# Patient Record
Sex: Male | Born: 1950 | Race: Black or African American | Hispanic: No | Smoking: Current every day smoker
Health system: Southern US, Community
[De-identification: ages and names within clinical notes are randomized; demographics above are authoritative.]

## PROBLEM LIST (undated history)

## (undated) DIAGNOSIS — I639 Cerebral infarction, unspecified: Secondary | ICD-10-CM

## (undated) DIAGNOSIS — F329 Major depressive disorder, single episode, unspecified: Secondary | ICD-10-CM

## (undated) DIAGNOSIS — I10 Essential (primary) hypertension: Secondary | ICD-10-CM

## (undated) DIAGNOSIS — F32A Depression, unspecified: Secondary | ICD-10-CM

## (undated) DIAGNOSIS — I509 Heart failure, unspecified: Secondary | ICD-10-CM

## (undated) HISTORY — DX: Depression, unspecified: F32.A

## (undated) HISTORY — PX: FOOT SURGERY: SHX648

---
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
## 1999-04-06 ENCOUNTER — Emergency Department (HOSPITAL_COMMUNITY): Admission: EM | Admit: 1999-04-06 | Discharge: 1999-04-06 | Payer: Self-pay | Admitting: Emergency Medicine

## 1999-04-07 ENCOUNTER — Encounter: Payer: Self-pay | Admitting: Emergency Medicine

## 1999-06-28 ENCOUNTER — Encounter: Payer: Self-pay | Admitting: *Deleted

## 1999-06-28 ENCOUNTER — Emergency Department (HOSPITAL_COMMUNITY): Admission: EM | Admit: 1999-06-28 | Discharge: 1999-06-28 | Payer: Self-pay | Admitting: *Deleted

## 1999-09-21 ENCOUNTER — Emergency Department (HOSPITAL_COMMUNITY): Admission: EM | Admit: 1999-09-21 | Discharge: 1999-09-21 | Payer: Self-pay | Admitting: Emergency Medicine

## 1999-09-21 ENCOUNTER — Encounter: Payer: Self-pay | Admitting: Emergency Medicine

## 1999-12-19 ENCOUNTER — Emergency Department (HOSPITAL_COMMUNITY): Admission: EM | Admit: 1999-12-19 | Discharge: 1999-12-20 | Payer: Self-pay

## 2001-07-07 ENCOUNTER — Emergency Department (HOSPITAL_COMMUNITY): Admission: EM | Admit: 2001-07-07 | Discharge: 2001-07-07 | Payer: Self-pay | Admitting: Emergency Medicine

## 2001-11-20 ENCOUNTER — Encounter: Payer: Self-pay | Admitting: Emergency Medicine

## 2001-11-20 ENCOUNTER — Emergency Department (HOSPITAL_COMMUNITY): Admission: EM | Admit: 2001-11-20 | Discharge: 2001-11-20 | Payer: Self-pay | Admitting: *Deleted

## 2003-05-20 ENCOUNTER — Emergency Department (HOSPITAL_COMMUNITY): Admission: EM | Admit: 2003-05-20 | Discharge: 2003-05-20 | Payer: Self-pay | Admitting: Emergency Medicine

## 2004-11-13 ENCOUNTER — Emergency Department (HOSPITAL_COMMUNITY): Admission: EM | Admit: 2004-11-13 | Discharge: 2004-11-13 | Payer: Self-pay | Admitting: Emergency Medicine

## 2004-11-30 ENCOUNTER — Emergency Department (HOSPITAL_COMMUNITY): Admission: EM | Admit: 2004-11-30 | Discharge: 2004-11-30 | Payer: Self-pay | Admitting: *Deleted

## 2006-01-03 ENCOUNTER — Emergency Department (HOSPITAL_COMMUNITY): Admission: EM | Admit: 2006-01-03 | Discharge: 2006-01-03 | Payer: Self-pay | Admitting: *Deleted

## 2006-04-22 ENCOUNTER — Emergency Department (HOSPITAL_COMMUNITY): Admission: EM | Admit: 2006-04-22 | Discharge: 2006-04-22 | Payer: Self-pay | Admitting: Family Medicine

## 2007-03-06 ENCOUNTER — Emergency Department (HOSPITAL_COMMUNITY): Admission: EM | Admit: 2007-03-06 | Discharge: 2007-03-06 | Payer: Self-pay | Admitting: Emergency Medicine

## 2007-07-22 ENCOUNTER — Emergency Department (HOSPITAL_COMMUNITY): Admission: EM | Admit: 2007-07-22 | Discharge: 2007-07-22 | Payer: Self-pay | Admitting: Emergency Medicine

## 2008-10-16 ENCOUNTER — Emergency Department (HOSPITAL_COMMUNITY): Admission: EM | Admit: 2008-10-16 | Discharge: 2008-10-16 | Payer: Self-pay | Admitting: Emergency Medicine

## 2009-03-05 ENCOUNTER — Emergency Department (HOSPITAL_COMMUNITY): Admission: EM | Admit: 2009-03-05 | Discharge: 2009-03-05 | Payer: Self-pay | Admitting: Emergency Medicine

## 2009-04-20 ENCOUNTER — Emergency Department (HOSPITAL_COMMUNITY): Admission: EM | Admit: 2009-04-20 | Discharge: 2009-04-20 | Payer: Self-pay | Admitting: Emergency Medicine

## 2010-08-05 IMAGING — CR DG CHEST 2V
2 series · 2 of 2 positions shown · non-contrast
Comparison: 11/30/2004

CLINICAL DATA: Fall 2 days ago, left-sided chest pain

CHEST - 2 VIEW

[w chest pa *]
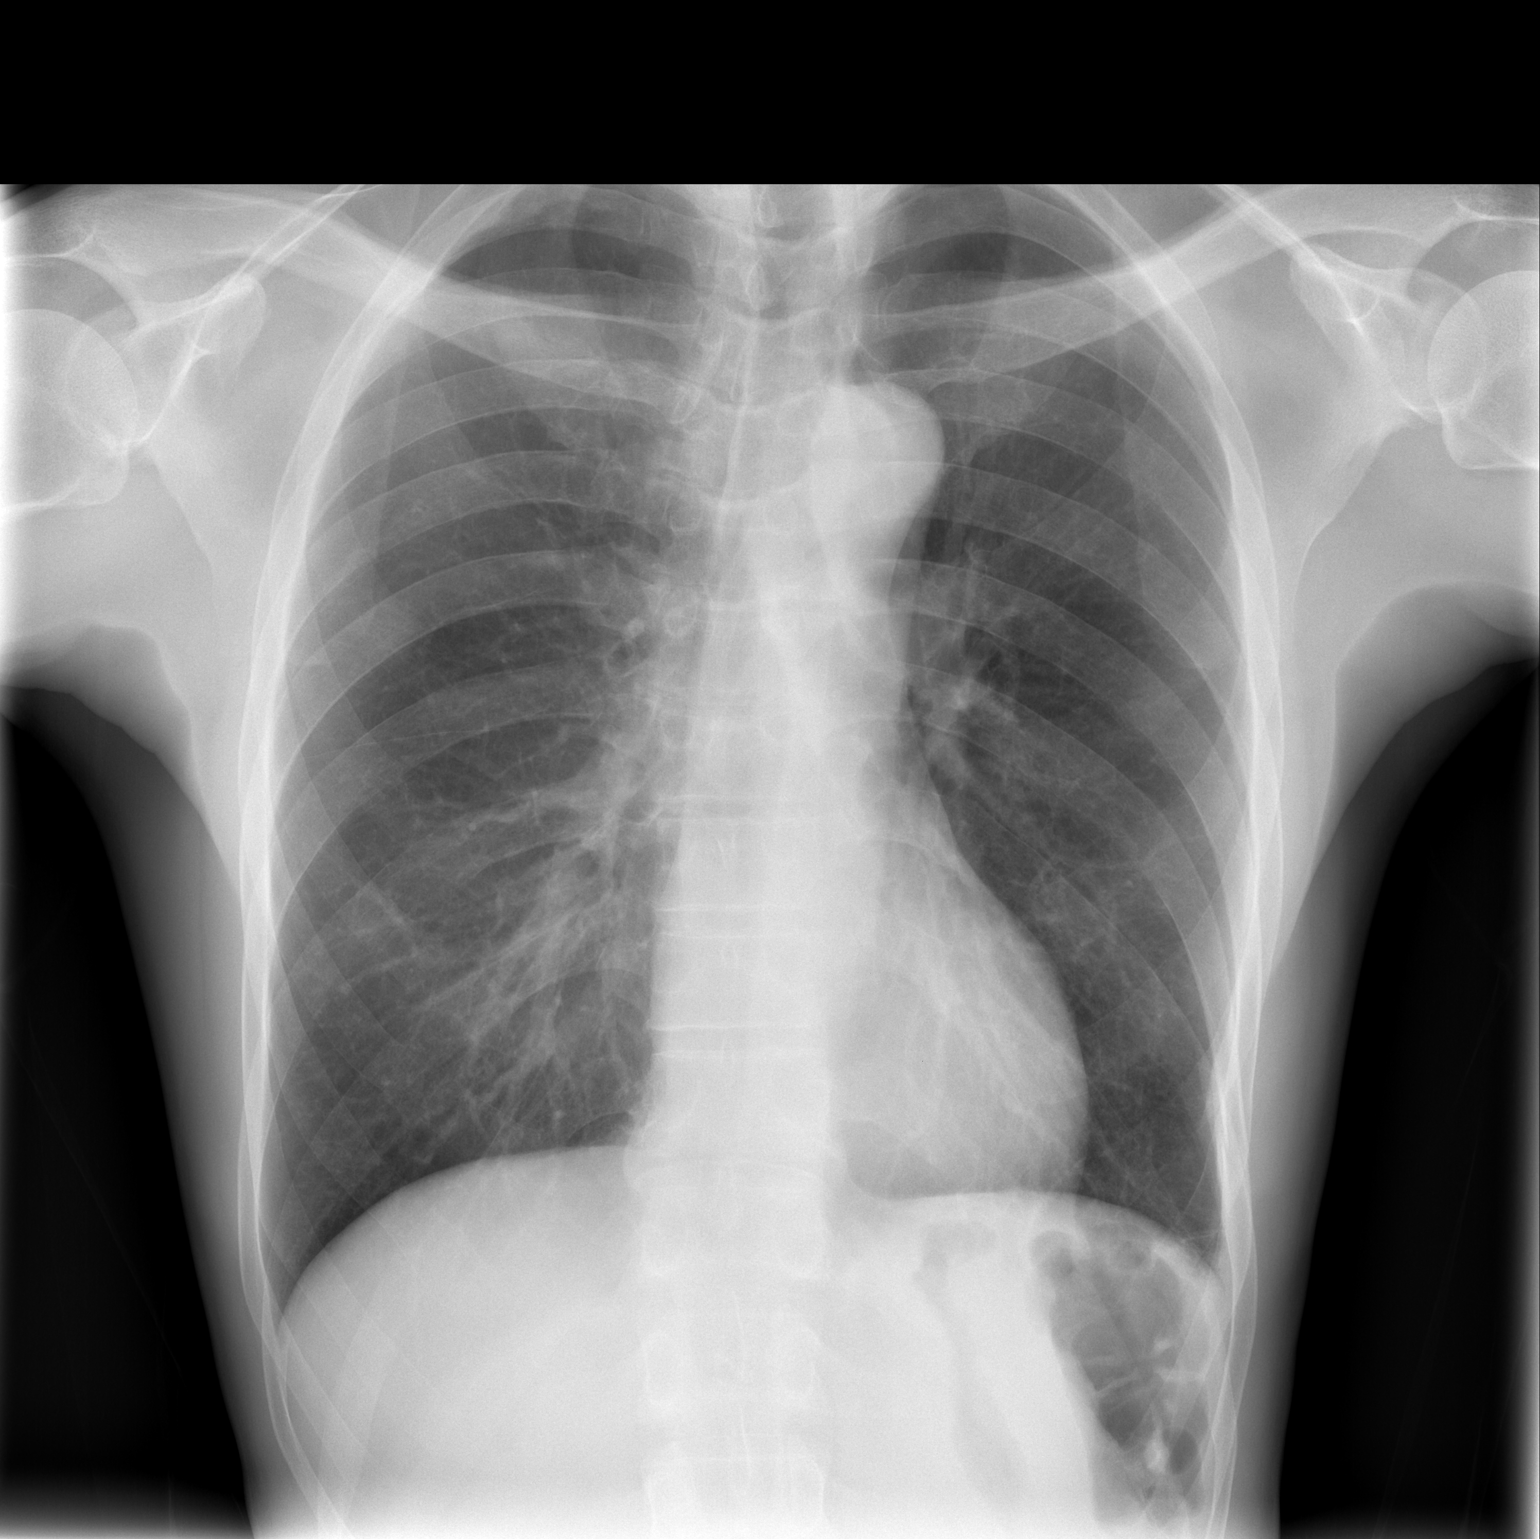

[w chest lat]
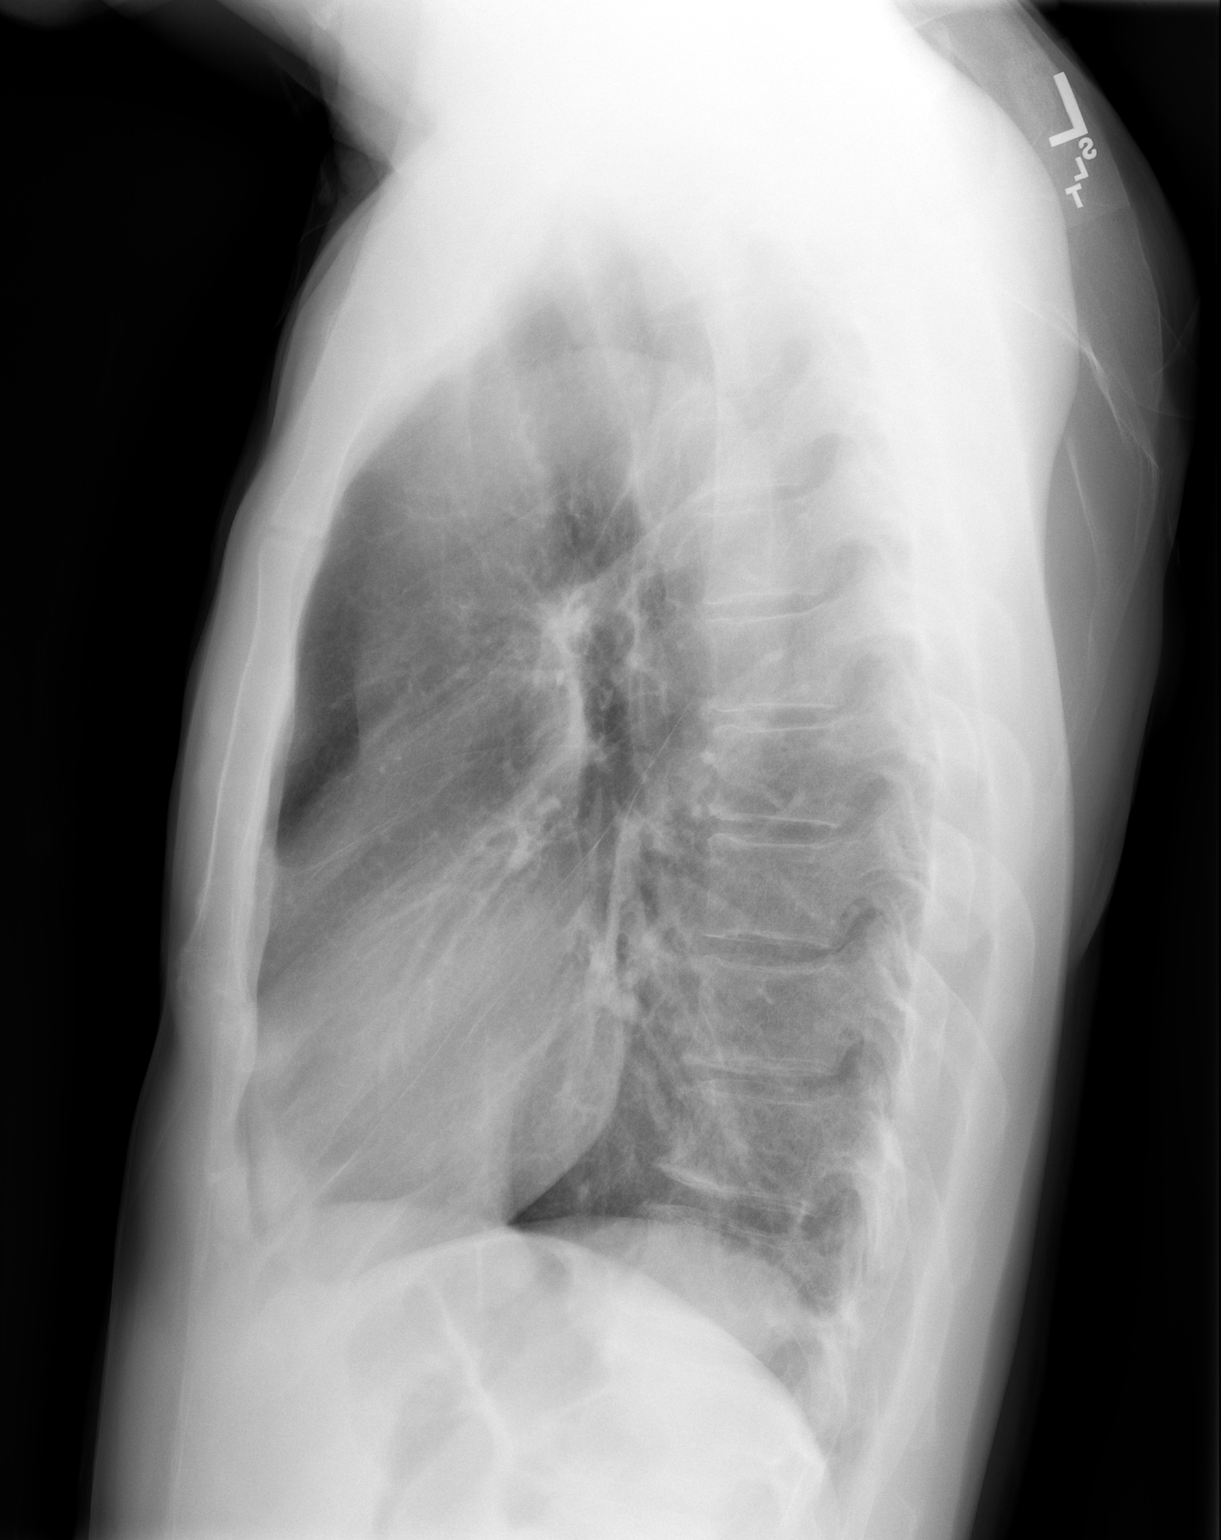

[2 of 2 positions shown; findings below may reference images not displayed]

FINDINGS: There is a nondisplaced fracture of the left lateral
inferior ribs, likely the 8th rib.  No pneumothorax.  Heart size is
normal.  Lungs are clear.
IMPRESSION: Nondisplaced acute fracture of the left lateral lower rib, likely
the 8th rib level.

## 2010-12-21 LAB — POCT CARDIAC MARKERS
CKMB, poc: <1 — ABNORMAL LOW
Troponin i, poc: <0.05

## 2010-12-21 LAB — I-STAT 8, (EC8 V) (CONVERTED LAB)
Chloride: 109
Glucose, Bld: 88
Hemoglobin: 15.6
Operator id: 208821
Potassium: 3.5
Sodium: 142
TCO2: 26
pH, Ven: 7.361 — ABNORMAL HIGH

## 2012-10-12 ENCOUNTER — Emergency Department (HOSPITAL_COMMUNITY)
Admission: EM | Admit: 2012-10-12 | Discharge: 2012-10-12 | Disposition: A | Discharge status: Home / Self Care | Payer: Self-pay | Attending: Emergency Medicine | Admitting: Emergency Medicine

## 2012-10-12 ENCOUNTER — Encounter (HOSPITAL_COMMUNITY): Payer: Self-pay | Admitting: *Deleted

## 2012-10-12 DIAGNOSIS — K0889 Other specified disorders of teeth and supporting structures: Secondary | ICD-10-CM

## 2012-10-12 DIAGNOSIS — K089 Disorder of teeth and supporting structures, unspecified: Secondary | ICD-10-CM | POA: Insufficient documentation

## 2012-10-12 DIAGNOSIS — I10 Essential (primary) hypertension: Secondary | ICD-10-CM | POA: Insufficient documentation

## 2012-10-12 HISTORY — DX: Essential (primary) hypertension: I10

## 2012-10-12 MED ORDER — HYDROCHLOROTHIAZIDE 25 MG PO TABS: 25.0000 mg | ORAL_TABLET | Freq: Every day | ORAL | Status: DC

## 2012-10-12 MED ORDER — AMOXICILLIN 500 MG PO CAPS
500.0000 mg | ORAL_CAPSULE | Freq: Once | ORAL | Status: AC
  Administered 2012-10-12: 500 mg via ORAL
  Filled 2012-10-12: qty 1

## 2012-10-12 MED ORDER — OXYCODONE-ACETAMINOPHEN 5-325 MG PO TABS
2.0000 | ORAL_TABLET | Freq: Once | ORAL | Status: AC
  Administered 2012-10-12: 2 via ORAL
  Filled 2012-10-12: qty 2

## 2012-10-12 MED ORDER — HYDROCHLOROTHIAZIDE 25 MG PO TABS
25.0000 mg | ORAL_TABLET | Freq: Once | ORAL | Status: AC
  Administered 2012-10-12: 25 mg via ORAL
  Filled 2012-10-12: qty 1

## 2012-10-12 MED ORDER — OXYCODONE-ACETAMINOPHEN 5-325 MG PO TABS: 1.0000 | ORAL_TABLET | Freq: Three times a day (TID) | ORAL | Status: DC | PRN

## 2012-10-12 MED ORDER — AMOXICILLIN 500 MG PO CAPS: 500.0000 mg | ORAL_CAPSULE | Freq: Three times a day (TID) | ORAL | Status: DC

## 2012-10-12 NOTE — ED Provider Notes (Signed)
  CSN: [id number]     Arrival date & time 10/12/12  1804 History     First MD Initiated Contact with Patient 10/12/12 1922     Chief Complaint  Patient presents with  . Dental Pain   (Consider location/radiation/quality/duration/timing/severity/associated sxs/prior Treatment) HPI\  Patient presents to the emergency department with a dental complaint. Symptoms began 2 days ago. The patient has tried to alleviate pain with OTC medications.  Pain rated at a 10/10, characterized as throbbing in nature and located right upper molar. Patient denies fever, night sweats, chills, difficulty swallowing or opening mouth, SOB, nuchal rigidity or decreased ROM of neck.  Patient does not have a dentist and requests a resource guide at discharge.  Pt noted to be hypertensive in triage.  Says he was diagnosed with hypertension but has not been on medication for years. No PCP. asymptomatic   Past Medical History  Diagnosis Date  . Hypertension    History reviewed. No pertinent past surgical history. History reviewed. No pertinent family history. History  Substance Use Topics  . Smoking status: Not on file  . Smokeless tobacco: Not on file  . Alcohol Use: Not on file    Review of Systems  HENT: Positive for dental problem.   All other systems reviewed and are negative.    Allergies  Review of patient's allergies indicates no known allergies.  Home Medications  No current outpatient prescriptions on file. BP 182/119  Pulse 90  Temp(Src) 98.3 F (36.8 C) (Oral)  Resp 16  SpO2 99% Physical Exam  Nursing note and vitals reviewed. Constitutional: He appears well-developed and well-nourished. No distress.  HENT:  Head: Normocephalic and atraumatic.  Mouth/Throat: Dental caries present.    Wide spread dental decay  Eyes: Conjunctivae and EOM are normal. Pupils are equal, round, and reactive to light.  Neck: Normal range of motion. Neck supple.  Cardiovascular: Normal rate and regular  rhythm.   Pulmonary/Chest: Effort normal and breath sounds normal.  Abdominal: Soft.  Neurological: He is alert.  Skin: Skin is warm and dry.    ED Course   Procedures (including critical care time)  Labs Reviewed - No data to display No results found. 1. Hypertension   2. Toothache     MDM  Discussed blood pressure with patient told him he is at risk for stroke and other adverse events if he does not get his blood pressure to control. Will start him on hydrochlorothiazide 25 mg and refer to PCP.  Patient has dental pain. No emergent s/sx's present. Patent airway. No trismus.  Will be given pain medication and antibiotics. I discussed the need to call dentist within 24/48 hours for follow-up. Dental referral given. Return to ED precautions given.  Pt voiced understanding and has agreed to follow-up.    Dorthula Matas, PA-C 10/12/12 1935

## 2012-10-12 NOTE — ED Notes (Signed)
Pt in c/o toothache and jaw swelling x2 days, pt states the tooth is loose and he thinks he has an infection in the tissue, facial swelling noted

## 2012-10-12 NOTE — ED Provider Notes (Signed)
Medical screening examination/treatment/procedure(s) were performed by non-physician practitioner and as supervising physician I was immediately available for consultation/collaboration.  Charles B. Sheldon, MD 10/12/12 2213 

## 2014-06-09 ENCOUNTER — Emergency Department (HOSPITAL_COMMUNITY): Payer: Self-pay

## 2014-06-09 ENCOUNTER — Emergency Department (HOSPITAL_COMMUNITY)
Admission: EM | Admit: 2014-06-09 | Discharge: 2014-06-09 | Disposition: A | Discharge status: Home / Self Care | Payer: Self-pay | Attending: Emergency Medicine | Admitting: Emergency Medicine

## 2014-06-09 ENCOUNTER — Encounter (HOSPITAL_COMMUNITY): Payer: Self-pay | Admitting: Neurology

## 2014-06-09 DIAGNOSIS — I10 Essential (primary) hypertension: Secondary | ICD-10-CM | POA: Insufficient documentation

## 2014-06-09 DIAGNOSIS — Y9389 Activity, other specified: Secondary | ICD-10-CM | POA: Insufficient documentation

## 2014-06-09 DIAGNOSIS — W11XXXA Fall on and from ladder, initial encounter: Secondary | ICD-10-CM | POA: Insufficient documentation

## 2014-06-09 DIAGNOSIS — Z720 Tobacco use: Secondary | ICD-10-CM | POA: Insufficient documentation

## 2014-06-09 DIAGNOSIS — Y929 Unspecified place or not applicable: Secondary | ICD-10-CM | POA: Insufficient documentation

## 2014-06-09 DIAGNOSIS — S199XXA Unspecified injury of neck, initial encounter: Secondary | ICD-10-CM | POA: Insufficient documentation

## 2014-06-09 DIAGNOSIS — Y998 Other external cause status: Secondary | ICD-10-CM | POA: Insufficient documentation

## 2014-06-09 DIAGNOSIS — R55 Syncope and collapse: Secondary | ICD-10-CM | POA: Insufficient documentation

## 2014-06-09 DIAGNOSIS — W19XXXA Unspecified fall, initial encounter: Secondary | ICD-10-CM

## 2014-06-09 DIAGNOSIS — Z79899 Other long term (current) drug therapy: Secondary | ICD-10-CM | POA: Insufficient documentation

## 2014-06-09 DIAGNOSIS — Z792 Long term (current) use of antibiotics: Secondary | ICD-10-CM | POA: Insufficient documentation

## 2014-06-09 DIAGNOSIS — Z8673 Personal history of transient ischemic attack (TIA), and cerebral infarction without residual deficits: Secondary | ICD-10-CM | POA: Insufficient documentation

## 2014-06-09 HISTORY — DX: Cerebral infarction, unspecified: I63.9

## 2014-06-09 LAB — BASIC METABOLIC PANEL
ANION GAP: 12 (ref 5–15)
BUN: 11 mg/dL (ref 6–23)
CALCIUM: 9.1 mg/dL (ref 8.4–10.5)
CHLORIDE: 109 mmol/L (ref 96–112)
CO2: 22 mmol/L (ref 19–32)
CREATININE: 1.1 mg/dL (ref 0.50–1.35)
GFR calc Af Amer: 82 mL/min — ABNORMAL LOW (ref >90)
GFR, EST NON AFRICAN AMERICAN: 71 mL/min — AB (ref >90)
Glucose, Bld: 100 mg/dL — ABNORMAL HIGH (ref 70–99)
Potassium: 3.8 mmol/L (ref 3.5–5.1)
SODIUM: 143 mmol/L (ref 135–145)

## 2014-06-09 LAB — CBC
HCT: 40.3 % (ref 39.0–52.0)
HEMOGLOBIN: 13 g/dL (ref 13.0–17.0)
MCH: 30.8 pg (ref 26.0–34.0)
MCHC: 32.3 g/dL (ref 30.0–36.0)
MCV: 95.5 fL (ref 78.0–100.0)
Platelets: 221 10*3/uL (ref 150–400)
RBC: 4.22 MIL/uL (ref 4.22–5.81)
RDW: 13.1 % (ref 11.5–15.5)
WBC: 5.1 10*3/uL (ref 4.0–10.5)

## 2014-06-09 LAB — I-STAT TROPONIN, ED: TROPONIN I, POC: 0 ng/mL (ref 0.00–0.08)

## 2014-06-09 MED ORDER — HYDROCHLOROTHIAZIDE 25 MG PO TABS: 25.0000 mg | ORAL_TABLET | Freq: Every day | ORAL | Status: DC

## 2014-06-09 NOTE — ED Notes (Signed)
C-collar applied

## 2014-06-09 NOTE — ED Notes (Signed)
Pt reports today at 1000 was on a ladder and fell 6 ft, he woke up on his back does not remember what happened. Now c/o left side feeling numb. Equal grips, no drift. Pt is a x 4. Moves all extremities.

## 2014-06-09 NOTE — Discharge Instructions (Signed)
You were offered admission for evaluation of why he passed out and you have deferred at this time. The risk of sudden cardiac death has been explained to you and you except this. Please return here if you change in mind and follow-up at the [facility] for management of your high blood pressure   Hypertension Hypertension, commonly called high blood pressure, is when the force of blood pumping through your arteries is too strong. Your arteries are the blood vessels that carry blood from your heart throughout your body. A blood pressure reading consists of a higher number over a lower number, such as 110/72. The higher number (systolic) is the pressure inside your arteries when your heart pumps. The lower number (diastolic) is the pressure inside your arteries when your heart relaxes. Ideally you want your blood pressure below 120/80. Hypertension forces your heart to work harder to pump blood. Your arteries may become narrow or stiff. Having hypertension puts you at risk for heart disease, stroke, and other problems.  RISK FACTORS Some risk factors for high blood pressure are controllable. Others are not.  Risk factors you cannot control include:   Race. You may be at higher risk if you are African American.  Age. Risk increases with age.  Gender. Men are at higher risk than women before age 4 years. After age 49, women are at higher risk than men. Risk factors you can control include:  Not getting enough exercise or physical activity.  Being overweight.  Getting too much fat, sugar, calories, or salt in your diet.  Drinking too much alcohol. SIGNS AND SYMPTOMS Hypertension does not usually cause signs or symptoms. Extremely high blood pressure (hypertensive crisis) may cause headache, anxiety, shortness of breath, and nosebleed. DIAGNOSIS  To check if you have hypertension, your health care provider will measure your blood pressure while you are seated, with your arm held  at the level of your heart. It should be measured at least twice using the same arm. Certain conditions can cause a difference in blood pressure between your right and left arms. A blood pressure reading that is higher than normal on one occasion does not mean that you need treatment. If one blood pressure reading is high, ask your health care provider about having it checked again. TREATMENT  Treating high blood pressure includes making lifestyle changes and possibly taking medicine. Living a healthy lifestyle can help lower high blood pressure. You may need to change some of your habits. Lifestyle changes may include:  Following the DASH diet. This diet is high in fruits, vegetables, and whole grains. It is low in salt, red meat, and added sugars.  Getting at least 2 hours of brisk physical activity every week.  Losing weight if necessary.  Not smoking.  Limiting alcoholic beverages.  Learning ways to reduce stress. If lifestyle changes are not enough to get your blood pressure under control, your health care provider may prescribe medicine. You may need to take more than one. Work closely with your health care provider to understand the risks and benefits. HOME CARE INSTRUCTIONS  Have your blood pressure rechecked as directed by your health care provider.   Take medicines only as directed by your health care provider. Follow the directions carefully. Blood pressure medicines must be taken as prescribed. The medicine does not work as well when you skip doses. Skipping doses also puts you at risk for problems.   Do not smoke.   Monitor your blood pressure at home as  directed by your health care provider. SEEK MEDICAL CARE IF:   You think you are having a reaction to medicines taken.  You have recurrent headaches or feel dizzy.  You have swelling in your ankles.  You have trouble with your vision. SEEK IMMEDIATE MEDICAL CARE IF:  You develop a severe headache or  confusion.  You have unusual weakness, numbness, or feel faint.  You have severe chest or abdominal pain.  You vomit repeatedly.  You have trouble breathing. MAKE SURE YOU:   Understand these instructions.  Will watch your condition.  Will get help right away if you are not doing well or get worse. Document Released: 03/04/2005 Document Revised: 07/19/2013 Document Reviewed: 12/25/2012 [facility] Patient Information 2015 [place], [place]. This information is not intended to replace advice given to you by your health care provider. Make sure you discuss any questions you have with your health care provider. Syncope Syncope is a medical term for fainting or passing out. This means you lose consciousness and drop to the ground. People are generally unconscious for less than 5 minutes. You may have some muscle twitches for up to 15 seconds before waking up and returning to normal. Syncope occurs more often in older adults, but it can happen to anyone. While most causes of syncope are not dangerous, syncope can be a sign of a serious medical problem. It is important to seek medical care.  CAUSES  Syncope is caused by a sudden drop in blood flow to the brain. The specific cause is often not determined. Factors that can bring on syncope include:  Taking medicines that lower blood pressure.  Sudden changes in posture, such as standing up quickly.  Taking more medicine than prescribed.  Standing in one place for too long.  Seizure disorders.  Dehydration and excessive exposure to heat.  Low blood sugar (hypoglycemia).  Straining to have a bowel movement.  Heart disease, irregular heartbeat, or other circulatory problems.  Fear, emotional distress, seeing blood, or severe pain. SYMPTOMS  Right before fainting, you may:  Feel dizzy or light-headed.  Feel nauseous.  See all white or all black in your field of vision.  Have cold, clammy skin. DIAGNOSIS  Your health care provider  will ask about your symptoms, perform a physical exam, and perform an electrocardiogram (ECG) to record the electrical activity of your heart. Your health care provider may also perform other heart or blood tests to determine the cause of your syncope which may include:  Transthoracic echocardiogram (TTE). During echocardiography, sound waves are used to evaluate how blood flows through your heart.  Transesophageal echocardiogram (TEE).  Cardiac monitoring. This allows your health care provider to monitor your heart rate and rhythm in real time.  Holter monitor. This is a portable device that records your heartbeat and can help diagnose heart arrhythmias. It allows your health care provider to track your heart activity for several days, if needed.  Stress tests by exercise or by giving medicine that makes the heart beat faster. TREATMENT  In most cases, no treatment is needed. Depending on the cause of your syncope, your health care provider may recommend changing or stopping some of your medicines. HOME CARE INSTRUCTIONS  Have someone stay with you until you feel stable.  Do not drive, use machinery, or play sports until your health care provider says it is okay.  Keep all follow-up appointments as directed by your health care provider.  Lie down right away if you start feeling like you might  faint. Breathe deeply and steadily. Wait until all the symptoms have passed.  Drink enough fluids to keep your urine clear or pale yellow.  If you are taking blood pressure or heart medicine, get up slowly and take several minutes to sit and then stand. This can reduce dizziness. SEEK IMMEDIATE MEDICAL CARE IF:   You have a severe headache.  You have unusual pain in the chest, abdomen, or back.  You are bleeding from your mouth or rectum, or you have black or tarry stool.  You have an irregular or very fast heartbeat.  You have pain with breathing.  You have repeated fainting or  seizure-like jerking during an episode.  You faint when sitting or lying down.  You have confusion.  You have trouble walking.  You have severe weakness.  You have vision problems. If you fainted, call your local emergency services (911 in [place] Do not drive yourself to the hospital.  MAKE SURE YOU:  Understand these instructions.  Will watch your condition.  Will get help right away if you are not doing well or get worse. Document Released: 03/04/2005 Document Revised: 03/09/2013 Document Reviewed: 05/03/2011 [facility] Patient Information 2015 [place], [place]. This information is not intended to replace advice given to you by your health care provider. Make sure you discuss any questions you have with your health care provider.

## 2014-06-09 NOTE — ED Provider Notes (Signed)
CSN: [id number]     Arrival date & time 06/09/14  1203 History   First MD Initiated Contact with Patient 06/09/14 1554     Chief Complaint  Patient presents with  . Loss of Consciousness  . Fall     (Consider location/radiation/quality/duration/timing/severity/associated sxs/prior Treatment) HPI Comments: Patient here after having a syncopal event while he was standing on a ladder possibly 6 feet high. States she became dizzy and has some nausea and then seen was on the ground. Denies any chest pain or shortness of breath. No headache prior to the event. Denied any palpitations. Complains of some muscle stiffness and pain to his left side at this time. Denies any weakness in his arms or legs. No recent illnesses. Doesn't take any medications on a regular basis. Denies any abdominal pain.  Patient is a 64 y.o. male presenting with syncope and fall. The history is provided by the patient.  Loss of Consciousness Fall    Past Medical History  Diagnosis Date  . Hypertension   . Stroke    History reviewed. No pertinent past surgical history. No family history on file. History  Substance Use Topics  . Smoking status: Current Every Day Smoker  . Smokeless tobacco: Not on file  . Alcohol Use: Yes    Review of Systems  Cardiovascular: Positive for syncope.  All other systems reviewed and are negative.     Allergies  Review of patient's allergies indicates no known allergies.  Home Medications   Prior to Admission medications   Medication Sig Start Date End Date Taking? Authorizing Provider  amoxicillin (AMOXIL) 500 MG capsule Take 1 capsule (500 mg total) by mouth 3 (three) times daily. 10/12/12   Tiffany Carlota Raspberry, PA-C  hydrochlorothiazide (HYDRODIURIL) 25 MG tablet Take 1 tablet (25 mg total) by mouth daily. 10/12/12   Delos Haring, PA-C  oxyCODONE-acetaminophen (PERCOCET/ROXICET) 5-325 MG per tablet Take 1-2 tablets by mouth every 8 (eight) hours as needed for pain. 10/12/12    Tiffany Carlota Raspberry, PA-C   BP 159/107 mmHg  Pulse 65  Temp(Src) 98.2 F (36.8 C) (Oral)  Resp 14  SpO2 98% Physical Exam  Constitutional: He is oriented to person, place, and time. He appears well-developed and well-nourished.  Non-toxic appearance. No distress.  HENT:  Head: Normocephalic and atraumatic.  Eyes: Conjunctivae, EOM and lids are normal. Pupils are equal, round, and reactive to light.  Neck: Normal range of motion. Neck supple. No tracheal deviation present. No thyroid mass present.  Cardiovascular: Normal rate, regular rhythm and normal heart sounds.  Exam reveals no gallop.   No murmur heard. Pulmonary/Chest: Effort normal and breath sounds normal. No stridor. No respiratory distress. He has no decreased breath sounds. He has no wheezes. He has no rhonchi. He has no rales.  Abdominal: Soft. Normal appearance and bowel sounds are normal. He exhibits no distension. There is no tenderness. There is no rebound and no CVA tenderness.  Musculoskeletal: Normal range of motion. He exhibits no edema or tenderness.  Neurological: He is alert and oriented to person, place, and time. He has normal strength. No cranial nerve deficit or sensory deficit. GCS eye subscore is 4. GCS verbal subscore is 5. GCS motor subscore is 6.  Skin: Skin is warm and dry. No abrasion and no rash noted.  Psychiatric: He has a normal mood and affect. His speech is normal and behavior is normal.  Nursing note and vitals reviewed.   ED Course  Procedures (including critical care time) Labs Review  Labs Reviewed  BASIC METABOLIC PANEL - Abnormal; Notable for the following:    Glucose, Bld 100 (*)    GFR calc non Af Amer 71 (*)    GFR calc Af Amer 82 (*)    All other components within normal limits  CBC  I-STAT TROPOININ, ED    Imaging Review Ct Head Wo Contrast  06/09/2014   CLINICAL DATA:  Syncope, fall from a ladder  EXAM: CT HEAD WITHOUT CONTRAST  CT CERVICAL SPINE WITHOUT CONTRAST  TECHNIQUE:  Multidetector CT imaging of the head and cervical spine was performed following the standard protocol without intravenous contrast. Multiplanar CT image reconstructions of the cervical spine were also generated.  COMPARISON:  None.  FINDINGS: CT HEAD FINDINGS  No skull fracture is noted. Paranasal sinuses and mastoid air cells are unremarkable.  Mild cerebral atrophy. Mild periventricular and patchy subcortical white matter decreased attenuation probable due to chronic small vessel ischemic changes. No definite acute cortical infarction. No mass lesion is noted on this unenhanced scan.  CT CERVICAL SPINE FINDINGS  Axial images of the cervical spine shows no acute fracture or subluxation. There is no pneumothorax in visualized lung apices. Computer processed images shows no acute fracture or subluxation. Degenerative changes are noted C1-C2 articulation. There is mild disc space flattening with mild anterior and mild posterior spurring at C3-C4 level. Mild anterior spurring noted lower endplate of C4 vertebral body. Moderate anterior spurring lower endplate of C5 vertebral body. There is mild disc space flattening at C5-C6 level. Significant disc space flattening with moderate anterior and mild posterior spurring at C6-C7 level. No prevertebral soft tissue swelling. Cervical airway is patent.  IMPRESSION: 1. No acute intracranial abnormality. Mild cerebral atrophy. Patchy subcortical and mild periventricular white matter decreased attenuation probable due to chronic small vessel ischemic changes. 2. No cervical spine acute fracture or subluxation. Multilevel degenerative changes as described above.   Electronically Signed   By: Lahoma Crocker M.D.   On: 06/09/2014 15:04   Ct Cervical Spine Wo Contrast  06/09/2014   CLINICAL DATA:  Syncope, fall from a ladder  EXAM: CT HEAD WITHOUT CONTRAST  CT CERVICAL SPINE WITHOUT CONTRAST  TECHNIQUE: Multidetector CT imaging of the head and cervical spine was performed following  the standard protocol without intravenous contrast. Multiplanar CT image reconstructions of the cervical spine were also generated.  COMPARISON:  None.  FINDINGS: CT HEAD FINDINGS  No skull fracture is noted. Paranasal sinuses and mastoid air cells are unremarkable.  Mild cerebral atrophy. Mild periventricular and patchy subcortical white matter decreased attenuation probable due to chronic small vessel ischemic changes. No definite acute cortical infarction. No mass lesion is noted on this unenhanced scan.  CT CERVICAL SPINE FINDINGS  Axial images of the cervical spine shows no acute fracture or subluxation. There is no pneumothorax in visualized lung apices. Computer processed images shows no acute fracture or subluxation. Degenerative changes are noted C1-C2 articulation. There is mild disc space flattening with mild anterior and mild posterior spurring at C3-C4 level. Mild anterior spurring noted lower endplate of C4 vertebral body. Moderate anterior spurring lower endplate of C5 vertebral body. There is mild disc space flattening at C5-C6 level. Significant disc space flattening with moderate anterior and mild posterior spurring at C6-C7 level. No prevertebral soft tissue swelling. Cervical airway is patent.  IMPRESSION: 1. No acute intracranial abnormality. Mild cerebral atrophy. Patchy subcortical and mild periventricular white matter decreased attenuation probable due to chronic small vessel ischemic changes. 2. No cervical  spine acute fracture or subluxation. Multilevel degenerative changes as described above.   Electronically Signed   By: Lahoma Crocker M.D.   On: 06/09/2014 15:04     EKG Interpretation   Date/Time:  Thursday June 09 2014 12:11:31 EDT Ventricular Rate:  86 PR Interval:  126 QRS Duration: 96 QT Interval:  388 QTC Calculation: 464 R Axis:   82 Text Interpretation:  Normal sinus rhythm Right atrial enlargement Minimal  voltage criteria for LVH, may be normal variant Nonspecific ST  abnormality  Abnormal ECG No significant change since last tracing Confirmed by Ardythe Klute   MD, Cortlin Marano ([id number]) on 06/09/2014 3:56:27 PM      MDM   Final diagnoses:  Fall    Patient offered admission for evaluation of syncope. He has deferred at this time. Risk of death explained to him and he accepts this. He has capacity to make this decision. Neurological exam is normal. Patient is hypertensive here and will be given a refill for his diuretic and referral to the [facility], MD 06/09/14 [id number]

## 2014-06-09 NOTE — ED Notes (Signed)
Pt states feels much better.  Denies numbness or pain at this time.

## 2014-06-09 NOTE — ED Notes (Signed)
MD aware of elevated bp 

## 2014-11-22 ENCOUNTER — Emergency Department (HOSPITAL_COMMUNITY)
Admission: EM | Admit: 2014-11-22 | Discharge: 2014-11-22 | Disposition: A | Discharge status: Home / Self Care | Payer: Self-pay | Attending: Emergency Medicine | Admitting: Emergency Medicine

## 2014-11-22 ENCOUNTER — Encounter (HOSPITAL_COMMUNITY): Payer: Self-pay | Admitting: Emergency Medicine

## 2014-11-22 DIAGNOSIS — Z792 Long term (current) use of antibiotics: Secondary | ICD-10-CM | POA: Insufficient documentation

## 2014-11-22 DIAGNOSIS — Z720 Tobacco use: Secondary | ICD-10-CM | POA: Insufficient documentation

## 2014-11-22 DIAGNOSIS — J029 Acute pharyngitis, unspecified: Secondary | ICD-10-CM | POA: Insufficient documentation

## 2014-11-22 DIAGNOSIS — M62838 Other muscle spasm: Secondary | ICD-10-CM | POA: Insufficient documentation

## 2014-11-22 DIAGNOSIS — Z8673 Personal history of transient ischemic attack (TIA), and cerebral infarction without residual deficits: Secondary | ICD-10-CM | POA: Insufficient documentation

## 2014-11-22 DIAGNOSIS — I10 Essential (primary) hypertension: Secondary | ICD-10-CM | POA: Insufficient documentation

## 2014-11-22 DIAGNOSIS — Z79899 Other long term (current) drug therapy: Secondary | ICD-10-CM | POA: Insufficient documentation

## 2014-11-22 MED ORDER — DIAZEPAM 5 MG PO TABS: 5.0000 mg | ORAL_TABLET | Freq: Three times a day (TID) | ORAL | Status: DC | PRN

## 2014-11-22 MED ORDER — DIAZEPAM 5 MG PO TABS
5.0000 mg | ORAL_TABLET | Freq: Once | ORAL | Status: AC
  Administered 2014-11-22: 5 mg via ORAL
  Filled 2014-11-22: qty 1

## 2014-11-22 NOTE — Discharge Instructions (Signed)
Muscle Cramps and Spasms Muscle cramps and spasms occur when a muscle or muscles tighten and you have no control over this tightening (involuntary muscle contraction). They are a common problem and can develop in any muscle. The most common place is in the calf muscles of the leg. Both muscle cramps and muscle spasms are involuntary muscle contractions, but they also have differences:   Muscle cramps are sporadic and painful. They may last a few seconds to a quarter of an hour. Muscle cramps are often more forceful and last longer than muscle spasms.  Muscle spasms may or may not be painful. They may also last just a few seconds or much longer. CAUSES  It is uncommon for cramps or spasms to be due to a serious underlying problem. In many cases, the cause of cramps or spasms is unknown. Some common causes are:   Overexertion.   Overuse from repetitive motions (doing the same thing over and over).   Remaining in a certain position for a long period of time.   Improper preparation, form, or technique while performing a sport or activity.   Dehydration.   Injury.   Side effects of some medicines.   Abnormally low levels of the salts and ions in your blood (electrolytes), especially potassium and calcium. This could happen if you are taking water pills (diuretics) or you are pregnant.  Some underlying medical problems can make it more likely to develop cramps or spasms. These include, but are not limited to:   Diabetes.   Parkinson disease.   Hormone disorders, such as thyroid problems.   Alcohol abuse.   Diseases specific to muscles, joints, and bones.   Blood vessel disease where not enough blood is getting to the muscles.  HOME CARE INSTRUCTIONS   Stay well hydrated. Drink enough water and fluids to keep your urine clear or pale yellow.  It may be helpful to massage, stretch, and relax the affected muscle.  For tight or tense muscles, use a warm towel, heating  pad, or hot shower water directed to the affected area.  If you are sore or have pain after a cramp or spasm, applying ice to the affected area may relieve discomfort.  Put ice in a plastic bag.  Place a towel between your skin and the bag.  Leave the ice on for 15-20 minutes, 03-04 times a day.  Medicines used to treat a known cause of cramps or spasms may help reduce their frequency or severity. Only take over-the-counter or prescription medicines as directed by your caregiver. SEEK MEDICAL CARE IF:  Your cramps or spasms get more severe, more frequent, or do not improve over time.  MAKE SURE YOU:   Understand these instructions.  Will watch your condition.  Will get help right away if you are not doing well or get worse. Document Released: 08/24/2001 Document Revised: 06/29/2012 Document Reviewed: 02/19/2012 [facility] Patient Information 2015 [place], [place]. This information is not intended to replace advice given to you by your health care provider. Make sure you discuss any questions you have with your health care provider. Torticollis, Acute You have suddenly (acutely) developed a twisted neck (torticollis). This is usually a self-limited condition. CAUSES  Acute torticollis may be caused by malposition, trauma or infection. Most commonly, acute torticollis is caused by sleeping in an awkward position. Torticollis may also be caused by the flexion, extension or twisting of the neck muscles beyond their normal position. Sometimes, the exact cause may not be known. SYMPTOMS  Usually, there is pain and limited movement of the neck. Your neck may twist to one side. DIAGNOSIS  The diagnosis is often made by physical examination. X-rays, CT scans or MRIs may be done if there is a history of trauma or concern of infection. TREATMENT  For a common, stiff neck that develops during sleep, treatment is focused on relaxing the contracted neck muscle. Medications (including shots) may be used  to treat the problem. Most cases resolve in several days. Torticollis usually responds to conservative physical therapy. If left untreated, the shortened and spastic neck muscle can cause deformities in the face and neck. Rarely, surgery is required. HOME CARE INSTRUCTIONS   Use over-the-counter and prescription medications as directed by your caregiver.  Do stretching exercises and massage the neck as directed by your caregiver.  Follow up with physical therapy if needed and as directed by your caregiver. SEEK IMMEDIATE MEDICAL CARE IF:   You develop difficulty breathing or noisy breathing (stridor).  You drool, develop trouble swallowing or have pain with swallowing.  You develop numbness or weakness in the hands or feet.  You have changes in speech or vision.  You have problems with urination or bowel movements.  You have difficulty walking.  You have a fever.  You have increased pain. MAKE SURE YOU:   Understand these instructions.  Will watch your condition.  Will get help right away if you are not doing well or get worse. Document Released: 03/01/2000 Document Revised: 05/27/2011 Document Reviewed: 04/12/2009 [facility] Patient Information 2015 [place], [place]. This information is not intended to replace advice given to you by your health care provider. Make sure you discuss any questions you have with your health care provider.

## 2014-11-22 NOTE — ED Provider Notes (Signed)
CSN: [id number]     Arrival date & time 11/22/14  1923 History   First MD Initiated Contact with Patient 11/22/14 2110     Chief Complaint  Patient presents with  . Neck Pain     (Consider location/radiation/quality/duration/timing/severity/associated sxs/prior Treatment) HPI Comments: Patient presents to the ED with a chief complaint of right sided neck pain.  He states that he slept wrong the other night (Saturday) and when he awoke he had soreness on the right side of his neck.  He states that the pain is worsened with palpation and movement.  He has tried using bengay on it with no relief.  He denies fevers, chills, or headache.  Denies any sick contacts.  Denies any other associated symptoms.  States that he did have a mild sore throat on Saturday.  The history is provided by the patient. No language interpreter was used.    Past Medical History  Diagnosis Date  . Hypertension   . Stroke    History reviewed. No pertinent past surgical history. No family history on file. Social History  Substance Use Topics  . Smoking status: Current Every Day Smoker  . Smokeless tobacco: None  . Alcohol Use: Yes    Review of Systems  Constitutional: Negative for fever and chills.  HENT: Positive for sore throat.   Respiratory: Negative for shortness of breath.   Cardiovascular: Negative for chest pain.  Gastrointestinal: Negative for nausea, vomiting, diarrhea and constipation.  Genitourinary: Negative for dysuria.  Musculoskeletal: Positive for neck pain.      Allergies  Review of patient's allergies indicates no known allergies.  Home Medications   Prior to Admission medications   Medication Sig Start Date End Date Taking? Authorizing Provider  amoxicillin (AMOXIL) 500 MG capsule Take 1 capsule (500 mg total) by mouth 3 (three) times daily. 10/12/12   Tiffany Carlota Raspberry, PA-C  hydrochlorothiazide (HYDRODIURIL) 25 MG tablet Take 1 tablet (25 mg total) by mouth daily. 06/09/14   Lacretia Leigh, MD  oxyCODONE-acetaminophen (PERCOCET/ROXICET) 5-325 MG per tablet Take 1-2 tablets by mouth every 8 (eight) hours as needed for pain. 10/12/12   Tiffany Carlota Raspberry, PA-C   BP 158/111 mmHg  Pulse 81  Temp(Src) 98.8 F (37.1 C) (Oral)  Resp 16  Ht 6\' 2"  (1.88 m)  Wt 138 lb (62.596 kg)  BMI 17.71 kg/m2  SpO2 98% Physical Exam  Constitutional: He is oriented to person, place, and time. He appears well-developed and well-nourished.  HENT:  Head: Normocephalic and atraumatic.  Oropharynx is mildly erythematous, no exudates, no abscess, uvula is midline, airway intact, normal phonation, no stridor  Eyes: Conjunctivae and EOM are normal. Pupils are equal, round, and reactive to light. Right eye exhibits no discharge. Left eye exhibits no discharge. No scleral icterus.  Neck: Normal range of motion. Neck supple. No JVD present.  Right upper trapezius is very tight and tender to palpation  Cardiovascular: Normal rate, regular rhythm and normal heart sounds.  Exam reveals no gallop and no friction rub.   No murmur heard. Pulmonary/Chest: Effort normal and breath sounds normal. No respiratory distress. He has no wheezes. He has no rales. He exhibits no tenderness.  Abdominal: Soft. He exhibits no distension and no mass. There is no tenderness. There is no rebound and no guarding.  Musculoskeletal: Normal range of motion. He exhibits no edema or tenderness.  Normal ROM and strength of extremities  Neurological: He is alert and oriented to person, place, and time.  CN 3-12 intact,  speech is clear, movements are goal oriented  Skin: Skin is warm and dry.  No sign of skin abscess or cellulitis  Psychiatric: He has a normal mood and affect. His behavior is normal. Judgment and thought content normal.  Nursing note and vitals reviewed.   ED Course  Procedures (including critical care time)   MDM   Final diagnoses:  Muscle spasms of neck    Patient with right neck pain after sleeping  wrong 3 nights ago.  History and symptoms are consistent with muscle spasm.  Right upper trapezius is very ttp and hurts with ROM.  There are no neurologic deficits to indicate dissection.  No sign of abscess, cellulitis, or rash (shingles).  No fever or headache. Oropharynx is clear.  No stridor or abscess.  Will try valium.  Recommend close follow-up with PCP.  Patient well/non-toxic appearing.  Jokes around at bedside.  Encouraged close follow-up with PCP regarding BP.  Patient agrees with plan.    Montine Circle, PA-C 11/22/14 2148  Merrily Pew, MD 11/23/14 [phone]

## 2014-11-22 NOTE — ED Notes (Addendum)
Pt. woke up with right side neck pain " slept wrong " onset 3 days ago , denies injury , pain increases with movement and changing positions , denies fever or neck stiffness.

## 2014-12-09 ENCOUNTER — Ambulatory Visit: Payer: Self-pay | Admitting: Family Medicine

## 2018-02-25 ENCOUNTER — Ambulatory Visit (INDEPENDENT_AMBULATORY_CARE_PROVIDER_SITE_OTHER): Payer: Medicare HMO

## 2018-02-25 ENCOUNTER — Encounter (HOSPITAL_COMMUNITY): Payer: Self-pay | Admitting: Emergency Medicine

## 2018-02-25 ENCOUNTER — Ambulatory Visit (HOSPITAL_COMMUNITY)
Admission: EM | Admit: 2018-02-25 | Discharge: 2018-02-25 | Disposition: A | Discharge status: Home / Self Care | Payer: Medicare HMO | Attending: Family Medicine | Admitting: Family Medicine

## 2018-02-25 DIAGNOSIS — R030 Elevated blood-pressure reading, without diagnosis of hypertension: Secondary | ICD-10-CM | POA: Insufficient documentation

## 2018-02-25 DIAGNOSIS — I10 Essential (primary) hypertension: Secondary | ICD-10-CM

## 2018-02-25 DIAGNOSIS — J189 Pneumonia, unspecified organism: Secondary | ICD-10-CM

## 2018-02-25 MED ORDER — AMOXICILLIN-POT CLAVULANATE 875-125 MG PO TABS: 1.0000 | ORAL_TABLET | Freq: Two times a day (BID) | ORAL | 0 refills | Status: AC

## 2018-02-25 MED ORDER — AZITHROMYCIN 250 MG PO TABS: 250.0000 mg | ORAL_TABLET | Freq: Every day | ORAL | 0 refills | Status: DC

## 2018-02-25 MED ORDER — HYDROCHLOROTHIAZIDE 25 MG PO TABS: 25.0000 mg | ORAL_TABLET | Freq: Every day | ORAL | 1 refills | Status: DC

## 2018-02-25 MED ORDER — BENZONATATE 100 MG PO CAPS: 100.0000 mg | ORAL_CAPSULE | Freq: Three times a day (TID) | ORAL | 0 refills | Status: DC

## 2018-02-25 NOTE — Discharge Instructions (Signed)
X-rays showed a multifocal pneumonia Get plenty of rest and push fluids Use OTC medications as needed for symptomatic relief of fever and body aches Two antibiotics have been prescribed.  Azithromycin and augmentin prescribed.  Take as prescribed and to completion.   Tessalon perles as needed for cough Please follow up with PCP for reevaluation and management  Return or go to ER if you have any new or worsening symptoms such as worsening cough, fever, fatigue,  Recommend repeat x-ray in 3-4 weeks to exclude underlying malignancy  Blood pressure also elevated in office today.  I have refilled your medication.  Please follow up with PCP for further evaluation and management of hypertension Please continue to monitor blood pressure at home and keep a log Eat a well balanced diet of fruits, vegetables and lean meats.  Avoid foods high in fat and salt Drink water.  At least half your body weight in ounces Return or go to the ED if you have any new or worsening symptoms such as vision changes, fatigue, dizziness, chest pain, shortness of breath, nausea, swelling in your hands or feet, urinary symptoms, etc..Marland Kitchen

## 2018-02-25 NOTE — ED Provider Notes (Signed)
[place]   [id number] 02/25/18 Arrival Time: 9702   CC: Sinus pressure and cough  SUBJECTIVE: History from: patient.  Sy Saintjean Bentsen is a 67 y.o. 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  Admits to positive sick exposure with similar symptoms.  Has tried OTC nyquil with relief.  Symptoms are made worse at night.  Reports previous symptoms in the past and diagnosed with walking PNA when he was a teenager.   Complains of subjective fever, chills, fatigue, and intermittent wheezing.  Denies  rhinorrhea, sore throat, SOB, chest pain, nausea, changes in bowel or bladder habits.    ROS: As per HPI.  Hx significant for HTN.  Does not have a PCP.    Past Medical History:  Diagnosis Date  . Hypertension   . Stroke [facility])    History reviewed. No pertinent surgical history. No Known Allergies No current facility-administered medications on file prior to encounter.    No current outpatient medications on file prior to encounter.   Social History   Socioeconomic History  . Marital status: Legally Separated    Spouse name: Not on file  . Number of children: Not on file  . Years of education: Not on file  . Highest education level: Not on file  Occupational History  . Not on file  Social Needs  . Financial resource strain: Not on file  . Food insecurity:    Worry: Not on file    Inability: Not on file  . Transportation needs:    Medical: Not on file    Non-medical: Not on file  Tobacco Use  . Smoking status: Current Every Day Smoker    Packs/day: 0.50    Types: Cigarettes  . Smokeless tobacco: Never Used  Substance and Sexual Activity  . Alcohol use: Yes  . Drug use: Not on file  . Sexual activity: Not on file  Lifestyle  . Physical activity:    Days per week: Not on file    Minutes per session: Not on file  . Stress: Not on file    Relationships  . Social connections:    Talks on phone: Not on file    Gets together: Not on file    Attends religious service: Not on file    Active member of club or organization: Not on file    Attends meetings of clubs or organizations: Not on file    Relationship status: Not on file  . Intimate partner violence:    Fear of current or ex partner: Not on file    Emotionally abused: Not on file    Physically abused: Not on file    Forced sexual activity: Not on file  Other Topics Concern  . Not on file  Social History Narrative  . Not on file   History reviewed. No pertinent family history.  OBJECTIVE:  Vitals:   02/25/18 1153 02/25/18 1325  BP: (!) 146/109 (!) 146/99  Pulse: 94 81  Resp: 18 16  Temp: 98.4 F (36.9 C) 98.6 F (37 C)  TempSrc: Oral Oral  SpO2: 100% 100%     General appearance: alert; appears chronically ill, acutely fatigue, but nontoxic; speaking in full sentences and tolerating own secretions HEENT: NCAT; Ears: EACs clear, TMs pearly gray; Eyes: PERRL.  EOM grossly intact. Sinuses: nontender; Nose: nares patent without rhinorrhea, Throat: oropharynx clear, tonsils  non erythematous or enlarged, uvula midline  Neck: supple without LAD Lungs: unlabored respirations, symmetrical air entry; cough: mild; no respiratory distress; decreased breath sounds throughout Heart: regular rate and rhythm.  Radial pulses 2+ symmetrical bilaterally Skin: warm and dry Psychological: alert and cooperative; normal mood and affect  DIAGNOSTIC STUDIES:  Dg Chest 2 View  Result Date: 02/25/2018 CLINICAL DATA:  Shortness of breath with cough and chest pain EXAM: CHEST - 2 VIEW COMPARISON:  April 20, 2009. FINDINGS: There Is airspace consolidation in the posterior segment of the left lower lobe. There is also subtle infiltrate in the anterior segment of the right upper lobe. There is a subtle ill-defined area of opacity in the inferior lingula which may represent a third  focus of apparent pneumonia. Heart size and pulmonary vascularity are normal. No adenopathy. No bone lesions. IMPRESSION: Findings felt to be indicative of multifocal pneumonia, with the greatest degree of concentration of infiltrate in the posterior segment left lower lobe. No adenopathy evident. Followup PA and lateral chest radiographs recommended in 3-4 weeks following trial of antibiotic therapy to ensure resolution and exclude underlying malignancy. These results will be called to the ordering clinician or representative by the Radiologist Assistant, and communication documented in the PACS or zVision Dashboard. Electronically Signed   By: Lowella Grip III M.D.   On: 02/25/2018 12:45    ASSESSMENT & PLAN:  1. Multifocal pneumonia   2. Elevated blood pressure reading     Meds ordered this encounter  Medications  . hydrochlorothiazide (HYDRODIURIL) 25 MG tablet    Sig: Take 1 tablet (25 mg total) by mouth daily.    Dispense:  30 tablet    Refill:  1    Order Specific Question:   Supervising Provider    Answer:   Raylene Everts [[id number]]  . amoxicillin-clavulanate (AUGMENTIN) 875-125 MG tablet    Sig: Take 1 tablet by mouth every 12 (twelve) hours for 10 days.    Dispense:  20 tablet    Refill:  0    Order Specific Question:   Supervising Provider    Answer:   Raylene Everts [[id number]]  . azithromycin (ZITHROMAX) 250 MG tablet    Sig: Take 1 tablet (250 mg total) by mouth daily. Take first 2 tablets together, then 1 every day until finished.    Dispense:  6 tablet    Refill:  0    Order Specific Question:   Supervising Provider    Answer:   Raylene Everts [[id number]]  . benzonatate (TESSALON) 100 MG capsule    Sig: Take 1 capsule (100 mg total) by mouth every 8 (eight) hours.    Dispense:  21 capsule    Refill:  0    Order Specific Question:   Supervising Provider    Answer:   Raylene Everts [[id number]]   X-rays showed a multifocal pneumonia Get plenty of rest  and push fluids Use OTC medications as needed for symptomatic relief of fever and body aches Two antibiotics have been prescribed.  Azithromycin and augmentin prescribed.  Take as prescribed and to completion.   Please follow up with PCP for reevaluation and management  Return or go to ER if you have any new or worsening symptoms such as worsening cough, fever, fatigue,  Recommend repeat x-ray in 3-4 weeks to exclude underlying malignancy  Blood pressure also elevated in office today.  I have refilled your medication.  Please follow up with PCP for further evaluation and management  of hypertension Please continue to monitor blood pressure at home and keep a log Eat a well balanced diet of fruits, vegetables and lean meats.  Avoid foods high in fat and salt Drink water.  At least half your body weight in ounces Return or go to the ED if you have any new or worsening symptoms such as vision changes, fatigue, dizziness, chest pain, shortness of breath, nausea, swelling in your hands or feet, urinary symptoms, etc...  Reviewed expectations re: course of current medical issues. Questions answered. Outlined signs and symptoms indicating need for more acute intervention. Patient verbalized understanding. After Visit Summary given.         Lestine Box, PA-C 02/25/18 1413

## 2018-02-25 NOTE — ED Notes (Signed)
Patient able to ambulate independently  

## 2018-02-25 NOTE — ED Triage Notes (Signed)
Pt presents to [facility] for assessment of head cold turned in to cough, chest congestion.  States he has tried Nyquil at home without relief.

## 2018-03-02 ENCOUNTER — Encounter (HOSPITAL_COMMUNITY): Payer: Self-pay | Admitting: Emergency Medicine

## 2018-03-02 ENCOUNTER — Ambulatory Visit (HOSPITAL_COMMUNITY)
Admission: EM | Admit: 2018-03-02 | Discharge: 2018-03-02 | Disposition: A | Discharge status: Home / Self Care | Payer: Medicare HMO

## 2018-03-02 DIAGNOSIS — J189 Pneumonia, unspecified organism: Secondary | ICD-10-CM

## 2018-03-02 DIAGNOSIS — I10 Essential (primary) hypertension: Secondary | ICD-10-CM

## 2018-03-02 NOTE — ED Triage Notes (Signed)
Pt states he had pneumonia last week and was told to come back and be rechecked for the pneumonia to "make sure the medicine is working".

## 2018-03-02 NOTE — Discharge Instructions (Signed)
Make sure you finish Augmentin to continue working on your pneumonia. You need to have a repeat chest x-ray the first week of November to make sure your pneumonia is resolved.

## 2018-03-02 NOTE — ED Provider Notes (Signed)
MRN: [id number] DOB: 04/13/1950  Subjective:   Brett Myers is a 67 y.o. male presenting for recheck on pneumonia.  Patient was last seen on 02/25/2018, found to have multifocal pneumonia.  He was subsequently started on azithromycin and Augmentin.  Patient did have his blood pressure medications refilled as well and was advised to follow-up with his PCP for better management.  Today, he reports that he is doing better.  He still has a bit of a cough and still feels a little fatigue when he overexerts himself.  He is taking all medications prescribed.  He did establish primary care and has an appointment coming up with her.  He plans on holding off on his smoking.  Denies fever, chest pain, shortness of breath, nausea, vomiting, belly pain, confusion, dizziness, sinus pain, ear pain.  No current facility-administered medications for this encounter.   Current Outpatient Medications:  .  amoxicillin-clavulanate (AUGMENTIN) 875-125 MG tablet, Take 1 tablet by mouth every 12 (twelve) hours for 10 days., Disp: 20 tablet, Rfl: 0 .  azithromycin (ZITHROMAX) 250 MG tablet, Take 1 tablet (250 mg total) by mouth daily. Take first 2 tablets together, then 1 every day until finished., Disp: 6 tablet, Rfl: 0 .  benzonatate (TESSALON) 100 MG capsule, Take 1 capsule (100 mg total) by mouth every 8 (eight) hours., Disp: 21 capsule, Rfl: 0 .  hydrochlorothiazide (HYDRODIURIL) 25 MG tablet, Take 1 tablet (25 mg total) by mouth daily., Disp: 30 tablet, Rfl: 1   No Known Allergies  Past Medical History:  Diagnosis Date  . Hypertension   . Stroke [facility])      Denies past surgical history.  Objective:   Vitals: BP 117/85   Pulse 94   Temp 97.7 F (36.5 C)   Resp 16   SpO2 100%   Physical Exam Constitutional:      Appearance: He is well-developed.  HENT:     Head:     Comments: There is mild to moderate postnasal drainage.    Right Ear: Tympanic membrane normal.     Left Ear: Tympanic membrane  normal.     Nose: No congestion or rhinorrhea.     Mouth/Throat:     Mouth: Mucous membranes are moist.     Pharynx: Oropharynx is clear. No oropharyngeal exudate or posterior oropharyngeal erythema.  Eyes:     General: No scleral icterus.    Extraocular Movements: Extraocular movements intact.     Pupils: Pupils are equal, round, and reactive to light.  Cardiovascular:     Rate and Rhythm: Normal rate and regular rhythm.     Heart sounds: Normal heart sounds. No murmur. No friction rub. No gallop.   Pulmonary:     Effort: Pulmonary effort is normal. No respiratory distress.     Breath sounds: Normal breath sounds. No stridor. No wheezing, rhonchi or rales.  Chest:     Chest wall: No tenderness.  Neurological:     Mental Status: He is alert and oriented to person, place, and time.  Psychiatric:        Mood and Affect: Mood normal.        Behavior: Behavior normal.        Thought Content: Thought content normal.     Assessment and Plan :   Multifocal pneumonia  Essential hypertension  Improved, emphasized need to finish course of Augmentin.  Offered patient prednisone course to help with his ongoing cough but patient declined this for now.  He does plan  on checking in with his PCP in the next week.  Recommended he have a repeat chest x-ray the first week of January.  ER and return to clinic precautions reviewed.    Jaynee Eagles, PA-C 03/02/18 1200

## 2019-03-16 ENCOUNTER — Ambulatory Visit: Payer: Medicare Other | Attending: Internal Medicine

## 2019-03-16 DIAGNOSIS — Z20822 Contact with and (suspected) exposure to covid-19: Secondary | ICD-10-CM

## 2019-03-17 LAB — NOVEL CORONAVIRUS, NAA: SARS-CoV-2, NAA: NOT DETECTED

## 2019-03-18 ENCOUNTER — Telehealth: Payer: Self-pay | Admitting: *Deleted

## 2019-03-18 NOTE — Telephone Encounter (Signed)
Patient called given negative covid results . 

## 2019-03-23 ENCOUNTER — Encounter: Payer: Self-pay | Admitting: Gastroenterology

## 2019-04-08 ENCOUNTER — Telehealth: Payer: Self-pay | Admitting: *Deleted

## 2019-04-08 NOTE — Telephone Encounter (Signed)
Patient no show PV today. Called patient and left message for him to call us back to get the nurse visit rescheduled. Pt needs to call back before 5 pm today or the PV and colon will be cancelled.

## 2019-04-08 NOTE — Telephone Encounter (Signed)
Patient called back and reschedule the [facility] 04/14/2019

## 2019-04-14 ENCOUNTER — Ambulatory Visit (AMBULATORY_SURGERY_CENTER): Payer: Self-pay | Admitting: *Deleted

## 2019-04-14 ENCOUNTER — Other Ambulatory Visit: Payer: Self-pay

## 2019-04-14 VITALS — Temp 98.0°F | Ht 74.0 in | Wt 139.0 lb

## 2019-04-14 DIAGNOSIS — R195 Other fecal abnormalities: Secondary | ICD-10-CM

## 2019-04-14 DIAGNOSIS — Z01818 Encounter for other preprocedural examination: Secondary | ICD-10-CM

## 2019-04-14 MED ORDER — NA SULFATE-K SULFATE-MG SULF 17.5-3.13-1.6 GM/177ML PO SOLN: ORAL | 0 refills | Status: DC

## 2019-04-14 NOTE — Progress Notes (Signed)
Patient is here in-person for PV. Patient denies any allergies to eggs or soy. Patient denies any problems with anesthesia/sedation. Patient denies any oxygen use at home. Patient denies taking any diet/weight loss medications or blood thinners. Patient is not being treated for MRSA or C-diff. EMMI education assisgned to the patient for the procedure, this was explained and instructions given to patient. COVID-19 screening test is on 1/29, the pt is aware. Pt is aware that care partner will wait in the car during procedure; if they feel like they will be too hot or cold to wait in the car; they may wait in the 4 th floor lobby. Patient is aware to bring only one care partner. We want them to wear a mask (we do not have any that we can provide them), practice social distancing, and we will check their temperatures when they get here.  I did remind the patient that their care partner needs to stay in the parking lot the entire time and have a cell phone available, we will call them when the pt is ready for discharge. Patient will wear mask into building.

## 2019-04-16 ENCOUNTER — Other Ambulatory Visit: Payer: Self-pay

## 2019-04-16 ENCOUNTER — Ambulatory Visit (INDEPENDENT_AMBULATORY_CARE_PROVIDER_SITE_OTHER): Payer: Self-pay

## 2019-04-16 DIAGNOSIS — Z1159 Encounter for screening for other viral diseases: Secondary | ICD-10-CM

## 2019-04-19 LAB — SARS CORONAVIRUS 2 (TAT 6-24 HRS): SARS Coronavirus 2: NEGATIVE

## 2019-04-21 ENCOUNTER — Ambulatory Visit (AMBULATORY_SURGERY_CENTER): Payer: Medicare Other | Admitting: Gastroenterology

## 2019-04-21 ENCOUNTER — Other Ambulatory Visit: Payer: Self-pay

## 2019-04-21 ENCOUNTER — Encounter: Payer: Self-pay | Admitting: Gastroenterology

## 2019-04-21 VITALS — BP 155/85 | HR 70 | Temp 97.3°F | Resp 15 | Ht 74.0 in | Wt 139.0 lb

## 2019-04-21 DIAGNOSIS — R195 Other fecal abnormalities: Secondary | ICD-10-CM | POA: Diagnosis not present

## 2019-04-21 DIAGNOSIS — D122 Benign neoplasm of ascending colon: Secondary | ICD-10-CM

## 2019-04-21 DIAGNOSIS — D124 Benign neoplasm of descending colon: Secondary | ICD-10-CM | POA: Diagnosis not present

## 2019-04-21 MED ORDER — SODIUM CHLORIDE 0.9 % IV SOLN: 500.0000 mL | Freq: Once | INTRAVENOUS | Status: DC

## 2019-04-21 NOTE — Progress Notes (Signed)
VS- Brett Myers [name]  Pt's states no medical or surgical changes since previsit or office visit.   Dr. Ardis Hughs is aware that pt ate vienna sausages until 1800 yesterday- ok to proceed.

## 2019-04-21 NOTE — Progress Notes (Signed)
Pt tolerated well. VSS. Awake and to recovery. 

## 2019-04-21 NOTE — Progress Notes (Signed)
Called to room to assist during endoscopic procedure.  Patient ID and intended procedure confirmed with present staff. Received instructions for my participation in the procedure from the performing physician.  

## 2019-04-21 NOTE — Op Note (Signed)
[facility] Patient Name: Brett Myers Procedure Date: 04/21/2019 9:58 AM MRN: [id number] Endoscopist: Milus Banister , MD Age: 69 Referring MD:  Date of Birth: November 23, 1950 Gender: Male Account #: [id number] Procedure:                Colonoscopy Indications:              Positive fecal immunochemical test Medicines:                Monitored Anesthesia Care Procedure:                Pre-Anesthesia Assessment:                           - Prior to the procedure, a History and Physical                            was performed, and patient medications and                            allergies were reviewed. The patient's tolerance of                            previous anesthesia was also reviewed. The risks                            and benefits of the procedure and the sedation                            options and risks were discussed with the patient.                            All questions were answered, and informed consent                            was obtained. Prior Anticoagulants: The patient has                            taken no previous anticoagulant or antiplatelet                            agents. ASA Grade Assessment: II - A patient with                            mild systemic disease. After reviewing the risks                            and benefits, the patient was deemed in                            satisfactory condition to undergo the procedure.                           After obtaining informed consent, the colonoscope  was passed under direct vision. Throughout the                            procedure, the patient's blood pressure, pulse, and                            oxygen saturations were monitored continuously. The                            Colonoscope was introduced through the anus and                            advanced to the the cecum, identified by                            appendiceal orifice and ileocecal  valve. The                            colonoscopy was performed without difficulty. The                            patient tolerated the procedure well. The quality                            of the bowel preparation was good. The ileocecal                            valve, appendiceal orifice, and rectum were                            photographed. Scope In: 10:03:47 AM Scope Out: 10:18:37 AM Scope Withdrawal Time: 0 hours 11 minutes 59 seconds  Total Procedure Duration: 0 hours 14 minutes 50 seconds  Findings:                 Six sessile polyps were found in the descending                            colon and ascending colon. The polyps were 2 to 5                            mm in size. These polyps were removed with a cold                            snare. Resection and retrieval were complete.                           The exam was otherwise without abnormality on                            direct and retroflexion views. Complications:            No immediate complications. Estimated blood loss:  None. Estimated Blood Loss:     Estimated blood loss: none. Impression:               - Six 2 to 5 mm polyps in the descending colon and                            in the ascending colon, removed with a cold snare.                            Resected and retrieved.                           - The examination was otherwise normal on direct                            and retroflexion views. Recommendation:           - Patient has a contact number available for                            emergencies. The signs and symptoms of potential                            delayed complications were discussed with the                            patient. Return to normal activities tomorrow.                            Written discharge instructions were provided to the                            patient.                           - Resume previous diet.                            - Continue present medications.                           - Await pathology results. Milus Banister, MD 04/21/2019 10:20:42 AM This report has been signed electronically.

## 2019-04-21 NOTE — Patient Instructions (Signed)
YOU HAD AN ENDOSCOPIC PROCEDURE TODAY AT [facility]:   Refer to the procedure report that was given to you for any specific questions about what was found during the examination.  If the procedure report does not answer your questions, please call your gastroenterologist to clarify.  If you requested that your care partner not be given the details of your procedure findings, then the procedure report has been included in a sealed envelope for you to review at your convenience later.  YOU SHOULD EXPECT: Some feelings of bloating in the abdomen. Passage of more gas than usual.  Walking can help get rid of the air that was put into your GI tract during the procedure and reduce the bloating. If you had a lower endoscopy (such as a colonoscopy or flexible sigmoidoscopy) you may notice spotting of blood in your stool or on the toilet paper. If you underwent a bowel prep for your procedure, you may not have a normal bowel movement for a few days.  Please Note:  You might notice some irritation and congestion in your nose or some drainage.  This is from the oxygen used during your procedure.  There is no need for concern and it should clear up in a day or so.  SYMPTOMS TO REPORT IMMEDIATELY:   Following lower endoscopy (colonoscopy or flexible sigmoidoscopy):  Excessive amounts of blood in the stool  Significant tenderness or worsening of abdominal pains  Swelling of the abdomen that is new, acute  Fever of 100F or higher    For urgent or emergent issues, a gastroenterologist can be reached at any hour by calling [phone].   DIET:  We do recommend a small meal at first, but then you may proceed to your regular diet.  Drink plenty of fluids but you should avoid alcoholic beverages for 24 hours.  ACTIVITY:  You should plan to take it easy for the rest of today and you should NOT DRIVE or use heavy machinery until tomorrow (because of the sedation medicines used during the test).     FOLLOW UP: Our staff will call the number listed on your records 48-72 hours following your procedure to check on you and address any questions or concerns that you may have regarding the information given to you following your procedure. If we do not reach you, we will leave a message.  We will attempt to reach you two times.  During this call, we will ask if you have developed any symptoms of COVID 19. If you develop any symptoms (ie: fever, flu-like symptoms, shortness of breath, cough etc.) before then, please call [phone].  If you test positive for Covid 19 in the 2 weeks post procedure, please call and report this information to [place].    If any biopsies were taken you will be contacted by phone or by letter within the next 1-3 weeks.  Please call us at [phone] if you have not heard about the biopsies in 3 weeks.    SIGNATURES/CONFIDENTIALITY: You and/or your care partner have signed paperwork which will be entered into your electronic medical record.  These signatures attest to the fact that that the information above on your After Visit Summary has been reviewed and is understood.  Full responsibility of the confidentiality of this discharge information lies with you and/or your care-partner.   Resume medications. Information given on polyps.

## 2019-04-23 ENCOUNTER — Telehealth: Payer: Self-pay

## 2019-04-23 NOTE — Telephone Encounter (Signed)
  Follow up Call-  Call back number 04/21/2019  Post procedure Call Back phone  # [phone]  Permission to leave phone message Yes  Some recent data might be hidden     Patient questions:  Do you have a fever, pain , or abdominal swelling? No. Pain Score  0 *  Have you tolerated food without any problems? Yes.    Have you been able to return to your normal activities? Yes.    Do you have any questions about your discharge instructions: Diet   No. Medications  No. Follow up visit  No.  Do you have questions or concerns about your Care? Patient said the oxygen made his nose run and he started sneezing. I advised him to blow his nose and to use nasal saline and that it should return to normal in the next day or so.  Actions: * If pain score is 4 or above: No action needed, pain <4.  1. Have you developed a fever since your procedure? no  2.   Have you had an respiratory symptoms (SOB or cough) since your procedure? no  3.   Have you tested positive for COVID 19 since your procedure no  4.   Have you had any family members/close contacts diagnosed with the COVID 19 since your procedure?  no   If yes to any of these questions please route to Joylene John, RN and Alphonsa Gin, Therapist, sports.

## 2019-04-26 ENCOUNTER — Encounter: Payer: Self-pay | Admitting: Gastroenterology

## 2019-09-29 ENCOUNTER — Other Ambulatory Visit: Payer: Self-pay | Admitting: General Practice

## 2019-09-29 ENCOUNTER — Other Ambulatory Visit (HOSPITAL_COMMUNITY): Payer: Self-pay | Admitting: General Practice

## 2019-09-29 DIAGNOSIS — I509 Heart failure, unspecified: Secondary | ICD-10-CM

## 2019-09-29 DIAGNOSIS — F17210 Nicotine dependence, cigarettes, uncomplicated: Secondary | ICD-10-CM

## 2019-09-29 DIAGNOSIS — I10 Essential (primary) hypertension: Secondary | ICD-10-CM

## 2019-10-14 ENCOUNTER — Ambulatory Visit (HOSPITAL_COMMUNITY): Payer: Medicare Other | Attending: Cardiovascular Disease

## 2019-10-14 ENCOUNTER — Other Ambulatory Visit: Payer: Self-pay

## 2019-10-14 DIAGNOSIS — I509 Heart failure, unspecified: Secondary | ICD-10-CM | POA: Diagnosis not present

## 2019-10-14 DIAGNOSIS — I10 Essential (primary) hypertension: Secondary | ICD-10-CM | POA: Insufficient documentation

## 2019-10-14 LAB — ECHOCARDIOGRAM COMPLETE
Area-P 1/2: 2.42 cm2
S' Lateral: 3 cm

## 2021-03-16 ENCOUNTER — Other Ambulatory Visit: Payer: Self-pay | Admitting: Family Medicine

## 2021-03-20 ENCOUNTER — Other Ambulatory Visit: Payer: Self-pay | Admitting: Family Medicine

## 2021-03-21 ENCOUNTER — Other Ambulatory Visit: Payer: Self-pay | Admitting: Family Medicine

## 2021-03-21 DIAGNOSIS — F17210 Nicotine dependence, cigarettes, uncomplicated: Secondary | ICD-10-CM

## 2022-06-21 ENCOUNTER — Encounter: Payer: Self-pay | Admitting: Gastroenterology

## 2023-02-25 ENCOUNTER — Emergency Department (HOSPITAL_COMMUNITY)
Admission: EM | Admit: 2023-02-25 | Discharge: 2023-02-25 | Disposition: A | Discharge status: Home / Self Care | Payer: 59 | Attending: Emergency Medicine | Admitting: Emergency Medicine

## 2023-02-25 ENCOUNTER — Other Ambulatory Visit: Payer: Self-pay

## 2023-02-25 ENCOUNTER — Encounter (HOSPITAL_COMMUNITY): Payer: Self-pay | Admitting: Emergency Medicine

## 2023-02-25 ENCOUNTER — Emergency Department (HOSPITAL_COMMUNITY): Payer: 59

## 2023-02-25 DIAGNOSIS — R051 Acute cough: Secondary | ICD-10-CM | POA: Diagnosis not present

## 2023-02-25 DIAGNOSIS — F17200 Nicotine dependence, unspecified, uncomplicated: Secondary | ICD-10-CM | POA: Insufficient documentation

## 2023-02-25 DIAGNOSIS — R042 Hemoptysis: Secondary | ICD-10-CM | POA: Diagnosis not present

## 2023-02-25 DIAGNOSIS — I10 Essential (primary) hypertension: Secondary | ICD-10-CM | POA: Diagnosis not present

## 2023-02-25 DIAGNOSIS — R059 Cough, unspecified: Secondary | ICD-10-CM | POA: Diagnosis present

## 2023-02-25 HISTORY — DX: Heart failure, unspecified: I50.9

## 2023-02-25 MED ORDER — DOXYCYCLINE HYCLATE 100 MG PO CAPS: 100.0000 mg | ORAL_CAPSULE | Freq: Two times a day (BID) | ORAL | 0 refills | Status: AC

## 2023-02-25 MED ORDER — DOXYCYCLINE HYCLATE 100 MG PO TABS
100.0000 mg | ORAL_TABLET | Freq: Once | ORAL | Status: AC
  Administered 2023-02-25: 100 mg via ORAL
  Filled 2023-02-25: qty 1

## 2023-02-25 NOTE — ED Provider Notes (Signed)
EMERGENCY DEPARTMENT AT [facility] Provider Note   CSN: [id number] Arrival date & time: 02/25/23  0100     History  Chief Complaint  Patient presents with   Cough    Brett Myers is a 71 y.o. male.  72 year old smoker who presents ER today secondary to cough with some pink-tinged sputum.  Patient states that started the sore throat 10-11 days ago which is always how his colds are started.  He then started having a nonproductive cough that started have a cough with green sputum and now notes a bit of pink-tinged to his sputum the last couple days.  No fevers.  No shortness of breath.  No pain besides in his throat.  No history of cancer.  Smokes about 1/4 pack a day.  No history of COPD or other lung conditions that he knows of.  No lower extremity swelling or history of pulmonary edema.   Cough      Home Medications Prior to Admission medications   Medication Sig Start Date End Date Taking? Authorizing Provider  doxycycline (VIBRAMYCIN) 100 MG capsule Take 1 capsule (100 mg total) by mouth 2 (two) times daily. One po bid x 7 days 02/25/23  Yes Osualdo Hansell, Barbara Cower, MD  cyclobenzaprine (FLEXERIL) 10 MG tablet  12/03/18   [provider]  lisinopril (ZESTRIL) 10 MG tablet Take 10 mg by mouth daily. 02/02/19   [provider]  nitroGLYCERIN (NITROSTAT) 0.4 MG SL tablet Place 0.4 mg under the tongue every 5 (five) minutes as needed for chest pain.    [provider]      Allergies    Patient has no known allergies.    Review of Systems   Review of Systems  Respiratory:  Positive for cough.     Physical Exam Updated Vital Signs BP (!) 160/111   Pulse 67   Temp 97.9 F (36.6 C) (Oral)   Resp 18   Ht 6\' 2"  (1.88 m)   Wt 60.8 kg   SpO2 100%   BMI 17.20 kg/m  Physical Exam Vitals and nursing note reviewed.  Constitutional:      Appearance: He is well-developed.  HENT:     Head: Normocephalic and atraumatic.   Cardiovascular:     Rate and Rhythm: Normal rate.  Pulmonary:     Effort: Pulmonary effort is normal. No respiratory distress.     Breath sounds: Rales (And diminished in left upper lobe) present.  Abdominal:     General: There is no distension.  Musculoskeletal:        General: Normal range of motion.     Cervical back: Normal range of motion.  Neurological:     Mental Status: He is alert.     ED Results / Procedures / Treatments   Labs (all labs ordered are listed, but only abnormal results are displayed) Labs Reviewed - No data to display  EKG None  Radiology DG Chest 2 View  Result Date: 02/25/2023 CLINICAL DATA:  Cough, chest tightness EXAM: CHEST - 2 VIEW COMPARISON:  02/25/2018 FINDINGS: Lungs are clear.  No pleural effusion or pneumothorax. The heart is normal in size. Visualized osseous structures are within normal limits. IMPRESSION: Normal chest radiographs. Electronically Signed   By: Charline Bills M.D.   On: 02/25/2023 01:57    Procedures Procedures    Medications Ordered in ED Medications  doxycycline (VIBRA-TABS) tablet 100 mg (has no administration in time range)    ED Course/ Medical Decision  Making/ A&P                                 Medical Decision Making Amount and/or Complexity of Data Reviewed Radiology: ordered.  Risk Prescription drug management.   Likely bronchitis however does not really have much wheezing.  With change in sputum and having some pink-tinged sputum concern for occult pneumonia, community-acquired pneumonia.  Also consider possible lung cancer however nothing obvious on his x-ray, no recent weight loss or other B symptoms.  Will initiate antibiotics, encourage smoking cessation and will follow-up with doctor in a week to ensure improvement and recheck blood pressures for further management of that.  Return here for any worsening blood in his sputum, shortness of breath, fever, feeling unwell or other concerning  symptoms.  First dose of antibiotics here the rest to be sent to his pharmacy.  Final Clinical Impression(s) / ED Diagnoses Final diagnoses:  Acute cough  Smoker  Blood-tinged sputum  Hypertension, unspecified type    Rx / DC Orders ED Discharge Orders          Ordered    doxycycline (VIBRAMYCIN) 100 MG capsule  2 times daily        02/25/23 0444              Marl Seago, Barbara Cower, MD 02/25/23 0448

## 2023-02-25 NOTE — ED Triage Notes (Signed)
Pt woke up coughing and at times coughing up clear and pink tinged sputum. Coughing has been going on a few days. Pt taking Nyquil and benadryl at home. Denies fevers, emesis, diarrhea. Pt has intermittent nausea. Pt concerned that with their hx of PNA, may be turning into that.

## 2023-02-25 NOTE — ED Notes (Signed)
Patient verbalizes understanding of discharge instructions. Opportunity for questioning and answers were provided. Armband removed by staff, pt discharged from ED. Ambulated out to lobby
